# Patient Record
Sex: Female | Born: 1955 | Race: White | Hispanic: No | Marital: Married
Health system: Southern US, Community
[De-identification: ages and names within clinical notes are randomized; demographics above are authoritative.]

## PROBLEM LIST (undated history)

## (undated) HISTORY — PX: BREAST EXCISIONAL BIOPSY: SUR124

---

## 2016-04-05 ENCOUNTER — Other Ambulatory Visit (HOSPITAL_COMMUNITY)
Admission: RE | Admit: 2016-04-05 | Discharge: 2016-04-05 | Disposition: A | Discharge status: Home / Self Care | Payer: Managed Care, Other (non HMO) | Source: Ambulatory Visit | Attending: Family Medicine | Admitting: Family Medicine

## 2016-04-05 ENCOUNTER — Other Ambulatory Visit: Payer: Self-pay | Admitting: Family Medicine

## 2016-04-05 DIAGNOSIS — Z124 Encounter for screening for malignant neoplasm of cervix: Secondary | ICD-10-CM | POA: Diagnosis present

## 2016-04-05 DIAGNOSIS — Z1151 Encounter for screening for human papillomavirus (HPV): Secondary | ICD-10-CM | POA: Diagnosis present

## 2016-04-07 LAB — CYTOLOGY - PAP
Diagnosis: NEGATIVE
HPV: NOT DETECTED

## 2017-12-12 ENCOUNTER — Other Ambulatory Visit: Payer: Self-pay | Admitting: Family Medicine

## 2017-12-12 DIAGNOSIS — Z1231 Encounter for screening mammogram for malignant neoplasm of breast: Secondary | ICD-10-CM

## 2018-01-11 ENCOUNTER — Ambulatory Visit
Admission: RE | Admit: 2018-01-11 | Discharge: 2018-01-11 | Disposition: A | Discharge status: Home / Self Care | Payer: 59 | Source: Ambulatory Visit | Attending: Family Medicine | Admitting: Family Medicine

## 2018-01-11 ENCOUNTER — Ambulatory Visit: Payer: Managed Care, Other (non HMO)

## 2018-01-11 DIAGNOSIS — Z1231 Encounter for screening mammogram for malignant neoplasm of breast: Secondary | ICD-10-CM

## 2018-01-30 ENCOUNTER — Other Ambulatory Visit: Payer: Self-pay | Admitting: Family Medicine

## 2018-01-30 DIAGNOSIS — R928 Other abnormal and inconclusive findings on diagnostic imaging of breast: Secondary | ICD-10-CM

## 2018-02-02 ENCOUNTER — Ambulatory Visit
Admission: RE | Admit: 2018-02-02 | Discharge: 2018-02-02 | Disposition: A | Discharge status: Home / Self Care | Payer: 59 | Source: Ambulatory Visit | Attending: Family Medicine | Admitting: Family Medicine

## 2018-02-02 ENCOUNTER — Other Ambulatory Visit: Payer: Self-pay | Admitting: Family Medicine

## 2018-02-02 DIAGNOSIS — R928 Other abnormal and inconclusive findings on diagnostic imaging of breast: Secondary | ICD-10-CM

## 2018-02-02 DIAGNOSIS — R599 Enlarged lymph nodes, unspecified: Secondary | ICD-10-CM

## 2018-05-22 ENCOUNTER — Other Ambulatory Visit: Payer: Self-pay | Admitting: Family Medicine

## 2018-05-22 ENCOUNTER — Other Ambulatory Visit (HOSPITAL_COMMUNITY)
Admission: RE | Admit: 2018-05-22 | Discharge: 2018-05-22 | Disposition: A | Discharge status: Home / Self Care | Payer: 59 | Source: Ambulatory Visit | Attending: Family Medicine | Admitting: Family Medicine

## 2018-05-22 DIAGNOSIS — Z124 Encounter for screening for malignant neoplasm of cervix: Secondary | ICD-10-CM | POA: Diagnosis not present

## 2018-05-25 LAB — CYTOLOGY - PAP
Diagnosis: NEGATIVE
HPV: NOT DETECTED

## 2018-08-07 ENCOUNTER — Other Ambulatory Visit: Payer: 59

## 2018-10-02 ENCOUNTER — Other Ambulatory Visit: Payer: Self-pay

## 2018-10-02 ENCOUNTER — Ambulatory Visit
Admission: RE | Admit: 2018-10-02 | Discharge: 2018-10-02 | Disposition: A | Discharge status: Home / Self Care | Payer: 59 | Source: Ambulatory Visit | Attending: Family Medicine | Admitting: Family Medicine

## 2018-10-02 DIAGNOSIS — R599 Enlarged lymph nodes, unspecified: Secondary | ICD-10-CM

## 2019-06-14 ENCOUNTER — Ambulatory Visit: Payer: 59

## 2019-08-27 ENCOUNTER — Other Ambulatory Visit: Payer: Self-pay

## 2019-08-27 ENCOUNTER — Ambulatory Visit: Payer: 59 | Admitting: Family Medicine

## 2019-08-27 ENCOUNTER — Encounter: Payer: Self-pay | Admitting: Family Medicine

## 2019-08-27 ENCOUNTER — Ambulatory Visit: Payer: Self-pay

## 2019-08-27 DIAGNOSIS — G8929 Other chronic pain: Secondary | ICD-10-CM | POA: Diagnosis not present

## 2019-08-27 DIAGNOSIS — M79645 Pain in left finger(s): Secondary | ICD-10-CM

## 2019-08-27 DIAGNOSIS — M25511 Pain in right shoulder: Secondary | ICD-10-CM

## 2019-08-27 MED ORDER — MELOXICAM 15 MG PO TABS: 7.5000 mg | ORAL_TABLET | Freq: Every day | ORAL | 6 refills | Status: AC | PRN

## 2019-08-27 NOTE — Progress Notes (Signed)
Amber Haynes - 64 y.o. female MRN [id number]  Date of birth: 1955/11/19  Office Visit Note: Visit Date: 08/27/2019 PCP: No primary care provider on file. Referred by: No ref. provider found  Subjective: Chief Complaint  Patient presents with  . Right Shoulder - Pain    [place] 05/02/19, hitting her head, shoulder and left hand hit steps and then a planter. Difficulty sleeping at night due to pain in the shoulder. Left hand dominant and types all day. Wearing compression glove - helps some.  . Left Thumb - Pain   HPI: Amber Haynes is a 64 y.o. female who comes in today with left shoulder and wrist pain.   She reports that she fell down stairs end of December, landed with left hand outstretched and also fell onto left shoulder. She has had pain in left thumb and radial side of wrist since the fall. No immediate swelling or bruising over wrist. She has been wearing compression sleeve over wrist which helps some. Pain with writing, opening jars, holding a leash. She is left handed.   Left shoulder hurts with overhead movements. Reports pain over lateral shoulder. She has tried ibuprofen with minimal relief. Sometimes keeps her up at night.   H/o diabetes, on insulin.  ROS Otherwise per HPI.  Assessment & Plan: Visit Diagnoses:  1. Chronic right shoulder pain   2. Pain of left thumb     Plan: 64 yo female presenting with left shoulder and wrist pain 4 months after a fall. Her shoulder exam shows impingement and pain with supraspinatus testing but good strength suggesting rotator cuff irritation with some impingement. She elected for corticosteroid injection today for pain control. Provided rotator cuff rehab exercises for home.   Left hand tender near snuff box as well as over [place]. X-rays of hand obtained showing arthritis in [facility] and scaphoid-trapezium joint as well as possible healing scaphoid fracture. Will place in wrist splint for 3 weeks, return for repeat imaging. Also provided  meloxicam, recommended glucosamine and tumeric.   Meds & Orders: No orders of the defined types were placed in this encounter.  No orders of the defined types were placed in this encounter.   Follow-up: No follow-ups on file.   Procedures: Procedure performed: subacromial corticosteroid injection; palpation guided  Consent obtained and verified. Time-out conducted. Noted no overlying erythema, induration, or other signs of local infection. The left posterior subacromial space was palpated and marked. The overlying skin was prepped in a sterile fashion. Topical analgesic spray: Ethyl chloride. Joint: left subacromial Needle: 25 gauge, 1.5 inch Completed without difficulty. Meds: 3 cc 1% lidocaine without epinephrine, 40 mg methylprednisolone   Advised to call if fevers/chills, erythema, induration, drainage, or persistent bleeding.   Clinical History: No specialty comments available.   She has no history on file for tobacco. No results for input(s): HGBA1C, LABURIC in the last 8760 hours.  Objective:  VS:  HT:    WT:   BMI:     BP:   HR: bpm  TEMP: ( )  RESP:  Physical Exam  PHYSICAL EXAM: Gen: NAD, alert, cooperative with exam, well-appearing HEENT: clear conjunctiva,  CV:  no edema, capillary refill brisk, normal rate Resp: non-labored Skin: no rashes, normal turgor  Neuro: no gross deficits.  Psych:  alert and oriented  Ortho Exam  Left Shoulder: Inspection reveals no obvious deformity, atrophy, or asymmetry. No bruising. No swelling Palpation: TTP over posterior acromion as well as lateral acromion over supraspinatus Full ROM in  flexion, abduction full but painful at endpoint, internal/external rotation (some pain with ER) NV intact distally Normal scapular function observed. Special Tests:  - Impingement: positive Hawkins, neers, empty can sign. - Supraspinatous: positive empty can.  5/5 strength with resisted flexion at 20 degrees-- pain with resistance -  Infraspinatous/Teres Minor: 5/5 strength with ER - Subscapularis: 5/5 strength with IR - Biceps tendon: Negative Speeds - Labrum: Negative Obriens - painful arc  Left wrist/hand: -Inspection: No deformity, no discoloration -Palpation: TTP over CMC joint as well as snuff box  -ROM: Normal range of motion with flexion, extension, radial deviation, ulnar deviation, pronation, supination -Strength: Flexion: 5/5; Extension: 5/5; Radial Deviation: 5/5; Ulnar Deviation: 5/5 -Special Tests: Tinels: Negative; Phalens: Negative; Finkelstein's: Negative -Limb neurovascularly intact   Imaging: No results found.  Past Medical/Family/Surgical/Social History: Medications & Allergies reviewed per EMR, new medications updated. There are no problems to display for this patient.  History reviewed. No pertinent past medical history. History reviewed. No pertinent family history. Past Surgical History:  Procedure Laterality Date  . BREAST EXCISIONAL BIOPSY Left over 10 years ago   benign cyst   Social History   Occupational History  . Not on file  Tobacco Use  . Smoking status: Not on file  Substance and Sexual Activity  . Alcohol use: Not on file  . Drug use: Not on file  . Sexual activity: Not on file

## 2019-08-27 NOTE — Progress Notes (Signed)
I saw and examined the patient with Dr. Robby Sermon and agree with assessment and plan as outlined.    She is 4 months status post fall resulting in left shoulder and hand pain.  Shoulder exam is significant for positive impingement signs and tenderness in the posterior subacromial space.  Rotator cuff strength is 5/5.  Left hand is tender near the anatomic snuffbox.  She has some pain with CMC grind test.  Phalen's test is negative.  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  I do not think she has a fracture in the scaphoid.  Left shoulder subacromial space was injected today.  Wrist splint for the left wrist along with glucosamine and turmeric.  Meloxicam as needed.  Repeat x-rays of the wrist in 3 to 4 weeks if not improving.  At that point could contemplate injection or hand therapy referral depending on symptoms.

## 2020-01-21 ENCOUNTER — Telehealth: Payer: Self-pay | Admitting: Family Medicine

## 2020-01-21 NOTE — Telephone Encounter (Signed)
Ic pt, advised needs to sign authorization. She ask that I email form to her and her husband will fax back tomorrow. I emailed to [email or web address]

## 2020-01-21 NOTE — Telephone Encounter (Signed)
Pt would like her appt notes from 08/27/19 faxed to Dr. Lucendia Herrlich office due to [place] not being in her network  Dr.Felder Fax# [phone]

## 2020-03-25 ENCOUNTER — Ambulatory Visit
Admission: RE | Admit: 2020-03-25 | Discharge: 2020-03-25 | Disposition: A | Discharge status: Home / Self Care | Payer: 59 | Source: Ambulatory Visit

## 2020-03-25 ENCOUNTER — Other Ambulatory Visit: Payer: Self-pay | Admitting: Family Medicine

## 2020-03-25 ENCOUNTER — Other Ambulatory Visit: Payer: Self-pay

## 2020-03-25 DIAGNOSIS — Z1231 Encounter for screening mammogram for malignant neoplasm of breast: Secondary | ICD-10-CM

## 2020-04-04 DIAGNOSIS — Z1231 Encounter for screening mammogram for malignant neoplasm of breast: Secondary | ICD-10-CM

## 2021-01-28 IMAGING — MG DIGITAL SCREENING BILAT W/ TOMO W/ CAD
6 of 10 series · 6 of 30 positions shown · non-contrast
Comparison: Previous exam(s).

CLINICAL DATA: Screening.

EXAM:
DIGITAL SCREENING BILATERAL MAMMOGRAM WITH TOMO AND CAD

[L CC synth-2D]
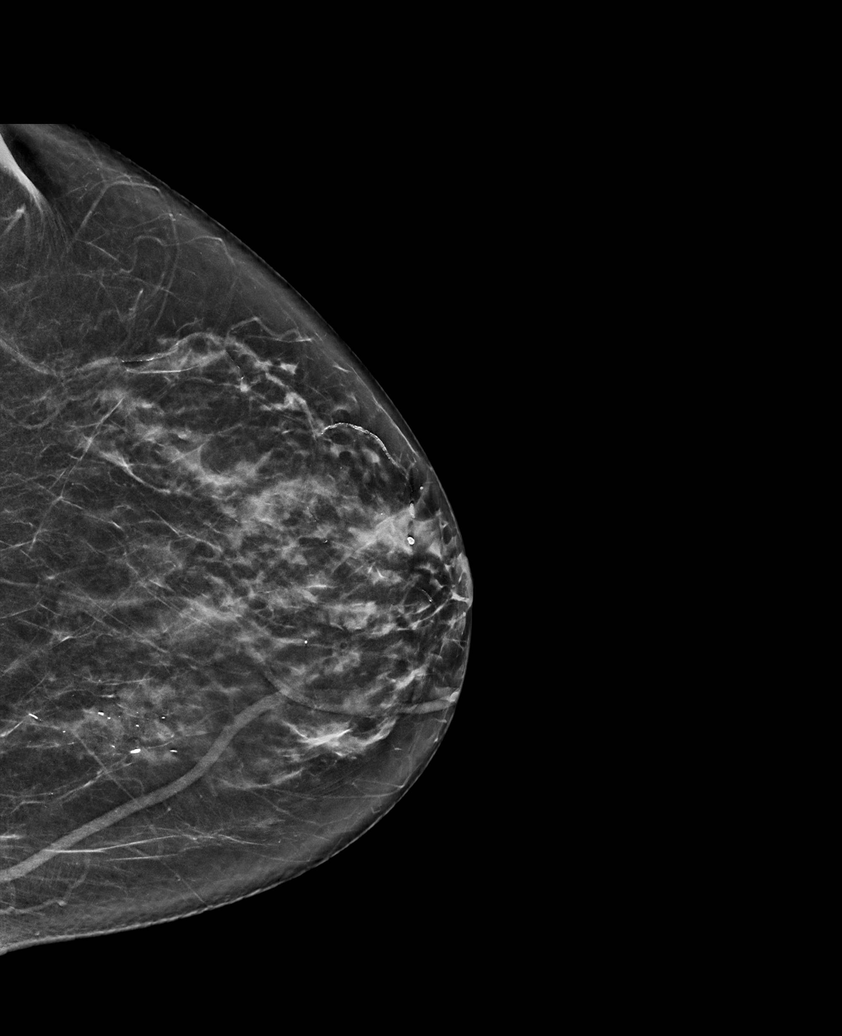

[R MLO synth-2D]
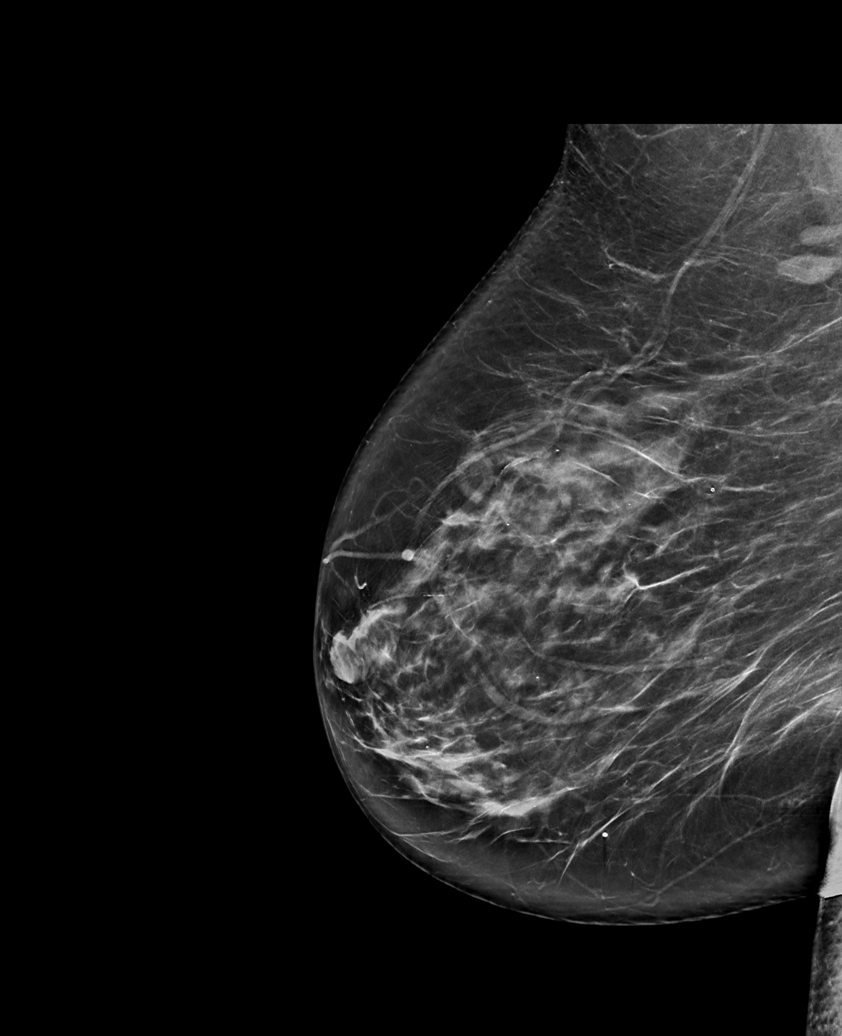

[R CC synth-2D (1 of 2)]
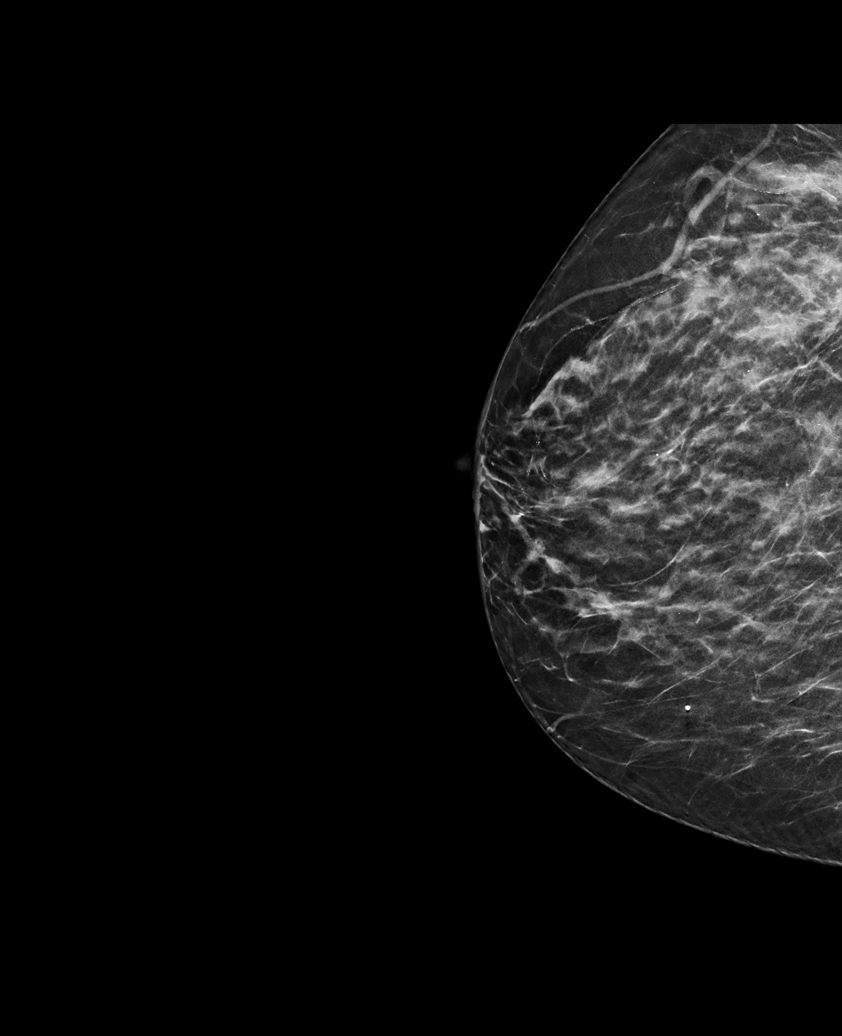

[R CC synth-2D (2 of 2)]
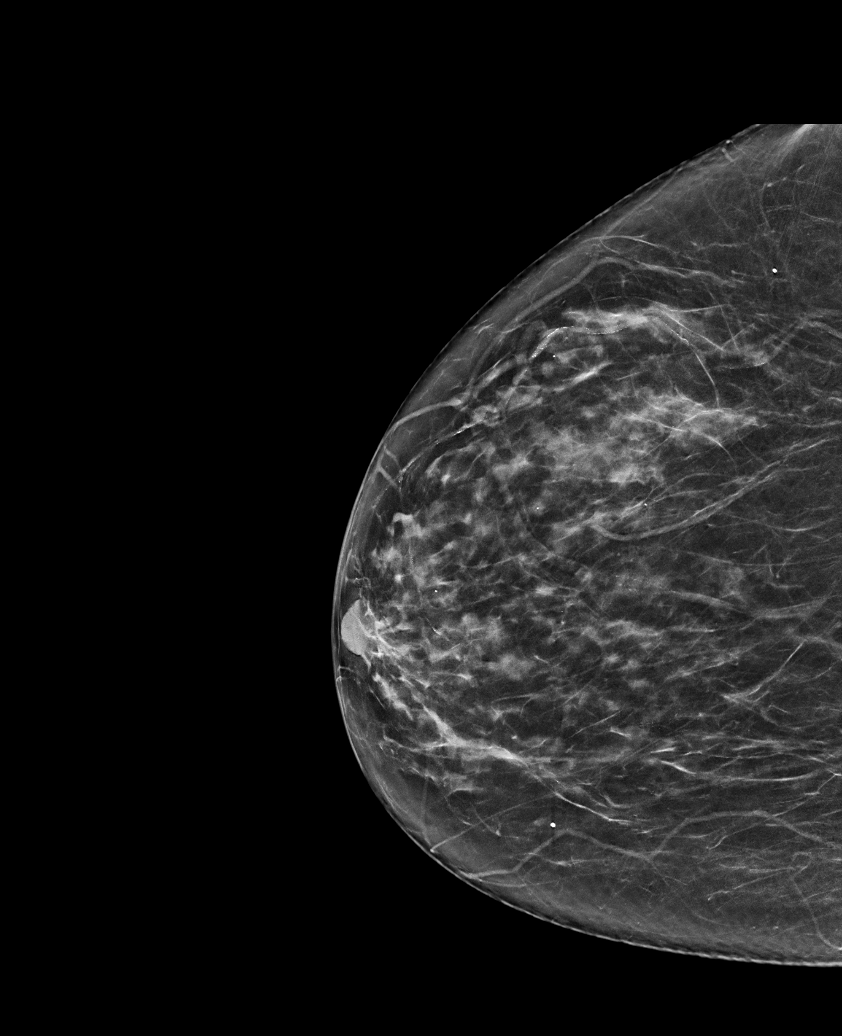

[L MLO synth-2D]
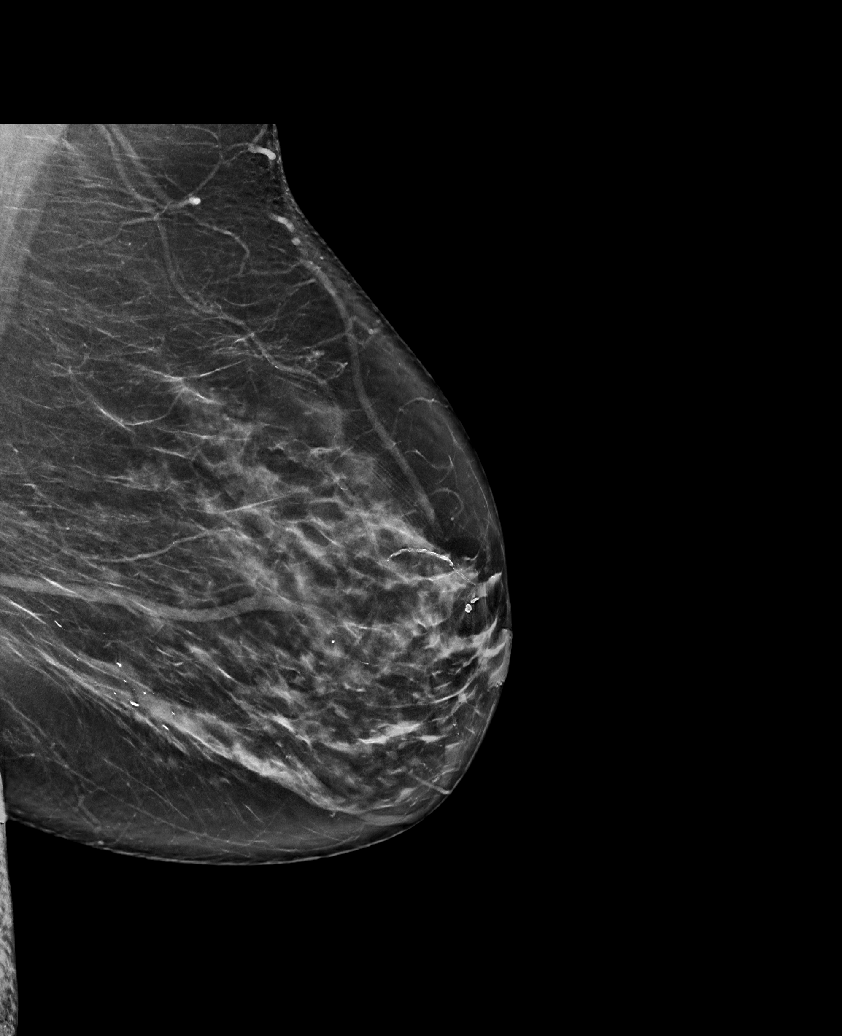

[L CC tomo · tomo slice 35/68.0]
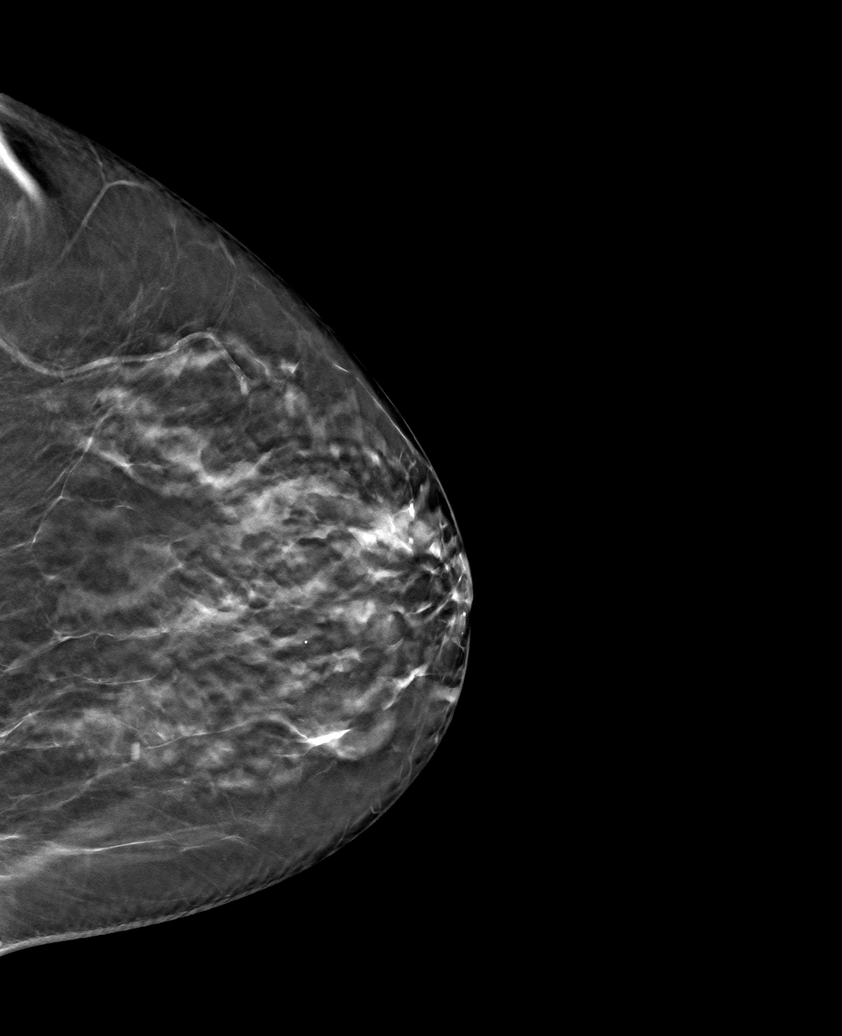

[6 of 30 positions shown; findings below may reference images not displayed]

ACR Breast Density Category c: The breast tissue is heterogeneously
dense, which may obscure small masses.
FINDINGS: There are no findings suspicious for malignancy. Images were
processed with CAD.
IMPRESSION: No mammographic evidence of malignancy. A result letter of this
screening mammogram will be mailed directly to the patient.

RECOMMENDATION:
Screening mammogram in one year. (Code:[id number])

BI-RADS CATEGORY  1: Negative.
